# Patient Record
Sex: Male | Born: 1955 | Race: White | Hispanic: No | Marital: Married | Smoking: Never smoker
Health system: Southern US, Community
[De-identification: ages and names within clinical notes are randomized; demographics above are authoritative.]

## PROBLEM LIST (undated history)

## (undated) DIAGNOSIS — I10 Essential (primary) hypertension: Secondary | ICD-10-CM

## (undated) DIAGNOSIS — I341 Nonrheumatic mitral (valve) prolapse: Secondary | ICD-10-CM

## (undated) DIAGNOSIS — E785 Hyperlipidemia, unspecified: Secondary | ICD-10-CM

## (undated) DIAGNOSIS — C801 Malignant (primary) neoplasm, unspecified: Secondary | ICD-10-CM

## (undated) HISTORY — DX: Nonrheumatic mitral (valve) prolapse: I34.1

## (undated) HISTORY — DX: Essential (primary) hypertension: I10

## (undated) HISTORY — PX: MOHS SURGERY: SUR867

## (undated) HISTORY — PX: WISDOM TOOTH EXTRACTION: SHX21

## (undated) HISTORY — DX: Malignant (primary) neoplasm, unspecified: C80.1

## (undated) HISTORY — DX: Hyperlipidemia, unspecified: E78.5

## (undated) HISTORY — PX: OTHER SURGICAL HISTORY: SHX169

---
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
## 2005-10-08 HISTORY — PX: COLONOSCOPY: SHX174

## 2017-04-29 DIAGNOSIS — F4321 Adjustment disorder with depressed mood: Secondary | ICD-10-CM | POA: Diagnosis not present

## 2017-06-06 DIAGNOSIS — F4321 Adjustment disorder with depressed mood: Secondary | ICD-10-CM | POA: Diagnosis not present

## 2017-07-01 DIAGNOSIS — F4321 Adjustment disorder with depressed mood: Secondary | ICD-10-CM | POA: Diagnosis not present

## 2017-07-13 DIAGNOSIS — F4321 Adjustment disorder with depressed mood: Secondary | ICD-10-CM | POA: Diagnosis not present

## 2017-07-26 DIAGNOSIS — F4321 Adjustment disorder with depressed mood: Secondary | ICD-10-CM | POA: Diagnosis not present

## 2017-10-12 DIAGNOSIS — F4321 Adjustment disorder with depressed mood: Secondary | ICD-10-CM | POA: Diagnosis not present

## 2017-10-26 DIAGNOSIS — F4321 Adjustment disorder with depressed mood: Secondary | ICD-10-CM | POA: Diagnosis not present

## 2017-11-16 DIAGNOSIS — F4321 Adjustment disorder with depressed mood: Secondary | ICD-10-CM | POA: Diagnosis not present

## 2017-11-19 DIAGNOSIS — R002 Palpitations: Secondary | ICD-10-CM | POA: Diagnosis not present

## 2017-11-19 DIAGNOSIS — N528 Other male erectile dysfunction: Secondary | ICD-10-CM | POA: Diagnosis not present

## 2017-11-19 DIAGNOSIS — H6123 Impacted cerumen, bilateral: Secondary | ICD-10-CM | POA: Diagnosis not present

## 2017-11-19 DIAGNOSIS — Z1389 Encounter for screening for other disorder: Secondary | ICD-10-CM | POA: Diagnosis not present

## 2017-11-19 DIAGNOSIS — I10 Essential (primary) hypertension: Secondary | ICD-10-CM | POA: Diagnosis not present

## 2017-12-07 DIAGNOSIS — F4321 Adjustment disorder with depressed mood: Secondary | ICD-10-CM | POA: Diagnosis not present

## 2018-01-02 DIAGNOSIS — H5213 Myopia, bilateral: Secondary | ICD-10-CM | POA: Diagnosis not present

## 2018-01-02 DIAGNOSIS — D3131 Benign neoplasm of right choroid: Secondary | ICD-10-CM | POA: Diagnosis not present

## 2018-01-02 DIAGNOSIS — H524 Presbyopia: Secondary | ICD-10-CM | POA: Diagnosis not present

## 2018-01-04 DIAGNOSIS — F4321 Adjustment disorder with depressed mood: Secondary | ICD-10-CM | POA: Diagnosis not present

## 2018-01-18 DIAGNOSIS — F4321 Adjustment disorder with depressed mood: Secondary | ICD-10-CM | POA: Diagnosis not present

## 2018-01-20 DIAGNOSIS — L814 Other melanin hyperpigmentation: Secondary | ICD-10-CM | POA: Diagnosis not present

## 2018-01-20 DIAGNOSIS — Z85828 Personal history of other malignant neoplasm of skin: Secondary | ICD-10-CM | POA: Diagnosis not present

## 2018-01-20 DIAGNOSIS — D225 Melanocytic nevi of trunk: Secondary | ICD-10-CM | POA: Diagnosis not present

## 2018-01-20 DIAGNOSIS — L738 Other specified follicular disorders: Secondary | ICD-10-CM | POA: Diagnosis not present

## 2018-02-08 DIAGNOSIS — F4321 Adjustment disorder with depressed mood: Secondary | ICD-10-CM | POA: Diagnosis not present

## 2018-02-10 DIAGNOSIS — Z125 Encounter for screening for malignant neoplasm of prostate: Secondary | ICD-10-CM | POA: Diagnosis not present

## 2018-02-10 DIAGNOSIS — R82998 Other abnormal findings in urine: Secondary | ICD-10-CM | POA: Diagnosis not present

## 2018-02-10 DIAGNOSIS — Z0000 Encounter for general adult medical examination without abnormal findings: Secondary | ICD-10-CM | POA: Diagnosis not present

## 2018-02-10 DIAGNOSIS — I10 Essential (primary) hypertension: Secondary | ICD-10-CM | POA: Diagnosis not present

## 2018-02-11 DIAGNOSIS — N528 Other male erectile dysfunction: Secondary | ICD-10-CM | POA: Diagnosis not present

## 2018-02-11 DIAGNOSIS — R972 Elevated prostate specific antigen [PSA]: Secondary | ICD-10-CM | POA: Diagnosis not present

## 2018-02-11 DIAGNOSIS — I10 Essential (primary) hypertension: Secondary | ICD-10-CM | POA: Diagnosis not present

## 2018-02-11 DIAGNOSIS — Z0000 Encounter for general adult medical examination without abnormal findings: Secondary | ICD-10-CM | POA: Diagnosis not present

## 2018-02-11 DIAGNOSIS — H6123 Impacted cerumen, bilateral: Secondary | ICD-10-CM | POA: Diagnosis not present

## 2018-02-17 DIAGNOSIS — Z1212 Encounter for screening for malignant neoplasm of rectum: Secondary | ICD-10-CM | POA: Diagnosis not present

## 2018-02-24 ENCOUNTER — Emergency Department
Admission: EM | Admit: 2018-02-24 | Discharge: 2018-02-25 | Discharge status: Against Medical Advice | Payer: BLUE CROSS/BLUE SHIELD | Source: Ambulatory Visit

## 2018-02-24 NOTE — ED Notes (Signed)
Pt reports he wants to leave and feels better and wants to go home. Seen walking out of emergency department

## 2018-02-24 NOTE — ED Triage Notes (Signed)
Pt here from urgent care. Pt on cipro for 2 weeks to "rule out prostatits" noticed increasing SOB and just trouble getting a deep breath. Pt reports 30 min after taking cipro he had trouble taking a deep breath. Pt reports it feels like anxiety. EKG at triage.        Triage Note   Rogelia Mire, RN

## 2018-02-25 ENCOUNTER — Observation Stay
Admission: EM | Admit: 2018-02-25 | Discharge: 2018-02-26 | Disposition: A | Discharge status: Home / Self Care | Payer: BLUE CROSS/BLUE SHIELD | Source: Ambulatory Visit | Attending: Emergency Medicine | Admitting: Emergency Medicine

## 2018-02-25 ENCOUNTER — Emergency Department: Payer: BLUE CROSS/BLUE SHIELD

## 2018-02-25 ENCOUNTER — Encounter: Payer: Self-pay | Admitting: Emergency Medicine

## 2018-02-25 DIAGNOSIS — R0602 Shortness of breath: Secondary | ICD-10-CM

## 2018-02-25 DIAGNOSIS — I341 Nonrheumatic mitral (valve) prolapse: Secondary | ICD-10-CM | POA: Insufficient documentation

## 2018-02-25 DIAGNOSIS — I10 Essential (primary) hypertension: Secondary | ICD-10-CM | POA: Insufficient documentation

## 2018-02-25 DIAGNOSIS — R0600 Dyspnea, unspecified: Principal | ICD-10-CM | POA: Insufficient documentation

## 2018-02-25 DIAGNOSIS — F419 Anxiety disorder, unspecified: Secondary | ICD-10-CM

## 2018-02-25 DIAGNOSIS — I77810 Thoracic aortic ectasia: Secondary | ICD-10-CM | POA: Insufficient documentation

## 2018-02-25 HISTORY — DX: Essential (primary) hypertension: I10

## 2018-02-25 LAB — BASIC METABOLIC PANEL
Anion Gap: 13 (ref 7–16)
CO2: 24 mmol/L (ref 20–28)
Calcium: 9.5 mg/dL (ref 8.6–10.2)
Chloride: 103 mmol/L (ref 96–108)
Creatinine: 1.11 mg/dL (ref 0.67–1.17)
GFR,Black: 81 *
GFR,Caucasian: 70 *
Glucose: 105 mg/dL — ABNORMAL HIGH (ref 60–99)
Lab: 17 mg/dL (ref 6–20)
Potassium: 4.6 mmol/L (ref 3.3–5.1)
Sodium: 140 mmol/L (ref 133–145)

## 2018-02-25 LAB — HM HIV SCREENING OFFERED

## 2018-02-25 LAB — HOLD GREEN WITH GEL

## 2018-02-25 LAB — CBC AND DIFFERENTIAL
Baso # K/uL: 0 10*3/uL (ref 0.0–0.1)
Basophil %: 0.5 %
Eos # K/uL: 0.2 10*3/uL (ref 0.0–0.5)
Eosinophil %: 1.8 %
Hematocrit: 47 % (ref 40–51)
Hemoglobin: 15.9 g/dL (ref 13.7–17.5)
IMM Granulocytes #: 0 10*3/uL
IMM Granulocytes: 0.2 %
Lymph # K/uL: 1.9 10*3/uL (ref 1.3–3.6)
Lymphocyte %: 23.4 %
MCH: 29 pg/cell (ref 26–32)
MCHC: 34 g/dL (ref 32–37)
MCV: 84 fL (ref 79–92)
Mono # K/uL: 1 10*3/uL — ABNORMAL HIGH (ref 0.3–0.8)
Monocyte %: 12.4 %
Neut # K/uL: 5 10*3/uL (ref 1.8–5.4)
Nucl RBC # K/uL: 0 10*3/uL (ref 0.0–0.0)
Nucl RBC %: 0 /100 WBC (ref 0.0–0.2)
Platelets: 262 10*3/uL (ref 150–330)
RBC: 5.6 MIL/uL (ref 4.6–6.1)
RDW: 12.8 % (ref 11.6–14.4)
Seg Neut %: 61.7 %
WBC: 8.1 10*3/uL (ref 4.2–9.1)

## 2018-02-25 LAB — D-DIMER, QUANTITATIVE: D-Dimer: 0.26 ug/mL FEU (ref 0.00–0.50)

## 2018-02-25 LAB — HOLD BLUE

## 2018-02-25 MED ORDER — DIAZEPAM 2 MG PO TABS *I*
4.0000 mg | ORAL_TABLET | Freq: Once | ORAL | Status: DC
Start: 2018-02-25 — End: 2018-02-26
  Filled 2018-02-25: qty 2

## 2018-02-25 MED ORDER — SODIUM CHLORIDE 0.9 % IV BOLUS *I*
500.0000 mL | Freq: Once | Status: AC
Start: 2018-02-25 — End: 2018-02-26

## 2018-02-25 MED ORDER — IOHEXOL 350 MG/ML (OMNIPAQUE) IV SOLN *I*
1.0000 mL | Freq: Once | INTRAVENOUS | Status: AC
Start: 2018-02-25 — End: 2018-02-25
  Administered 2018-02-25: 70 mL via INTRAVENOUS

## 2018-02-25 MED ORDER — HYDROXYZINE HCL 25 MG PO TABS *I*
25.0000 mg | ORAL_TABLET | Freq: Once | ORAL | Status: AC
Start: 2018-02-25 — End: 2018-02-25
  Administered 2018-02-25: 25 mg via ORAL
  Filled 2018-02-25: qty 1

## 2018-02-25 MED ORDER — SODIUM CHLORIDE 0.9 % FLUSH FOR PUMPS *I*
0.0000 mL/h | INTRAVENOUS | Status: DC | PRN
Start: 2018-02-25 — End: 2018-02-26

## 2018-02-25 NOTE — First Provider Contact (Signed)
ED First Provider Contact Note    Initial provider evaluation performed by   ED First Provider Contact     Date/Time Event User Comments    02/25/18 1212 ED First Provider Contact Valarie Cones, [facility] Initial Face to Face Provider Contact        Patient is frequent flyer, having intermittent SOB, states he was sent in by PCP     Vital signs reviewed.    Orders placed:  EKG, LABS and saline lock, telemetry     Patient requires further evaluation.     Anthonny Schiller ANN [place], NP, 02/25/2018, 12:12 PM     Valarie Cones, Chales Abrahams, NP  02/25/18 1214

## 2018-02-25 NOTE — ED Triage Notes (Signed)
Flew in from [place] two weeks ago and has been SOB since. His PCP from [place] would like a PE workup       Triage Note   John Lango, RN

## 2018-02-25 NOTE — Discharge Instructions (Addendum)
Your blood studies, heart tracing (ECG), and oxygen level while walking are normal.    The chest CT incidentally showed widened aorta above the heart - probably NOT the cause of the shortness of breath symptoms presently - and needs follow-up CT scan in about 9 - 12 months (this can be done through your local doctor at home).  There was no "blood clot", mass, or pneumonia in the lungs.  The report of this study is copied below.    Rest this coming day.  No unnecessary exertion.  Usual diet & medications.    Please call your primary MD at home for follow-up appointment in the next week.    Return to ED for any severe or worsening symptoms.    CT angiogram of chest:    CT angio chest (Final result)   Result time 02/25/18 21:07:51   Final result by Blenda Bridegroom, MD (02/25/18 21:07:51)                Impression:      The examination is diagnostic for evaluation of the pulmonary arteries.    No evidence of pulmonary embolism.     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    END OF IMPRESSION    [facility] submits this DICOM format image data and final report to the [facility], an independent secure electronic health information exchange, on a reciprocally searchable basis (with patient authorization) for a minimum of 12 months after exam   date.            Narrative:    02/25/2018 8:21 PM    CT ANGIO CHEST    CLINICAL INFORMATION:  r/o pe; >2 weeks sob after flight;  ;     COMPARISON:  None.    PROCEDURE:  CT angiographic technique was performed of the pulmonary arteries. Multiplanar reconstructions and MIP reconstructions were reviewed. Automated exposure control, adjustment of the mA and/or kV according to patient size, and/or iterative   reconstruction techniques were utilized for radiation dose optimization. Amount and type of contrast that was injected and/or discarded is recorded in the electronic medical record.    FINDINGS:      QUALITY: Diagnostic for evaluation for pulmonary embolism.    Pulmonary  Arteries: No evidence of pulmonary embolism.    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    Veins: Not formally evaluated.    Cardiac Chambers: No acute pathologic findings identified.    Pericardium: No pericardial effusion.    Neck base:  Normal.     Mediastinum and lymph nodes:  No lymphadenopathy by CT size criteria.    Pleura and Pleural space:  No pleural effusion, no pneumothorax.       Airways (Trachea/Bronchi):  The trachea and central bronchi are normal.      Lungs:  Mild dependent posterior basal bilateral atelectasis.    Upper abdomen:  Limited coverage and early contrast phase evaluation limits diagnostic evaluation.  No gross abnormality identified.      Soft Tissues/Musculoskeletal: No acute or significant abnormalities noted.

## 2018-02-25 NOTE — Consults (Signed)
Cardiac Surgery History and Physical Note:    HPI:   CC: SOB/anxiety after taking Cipro    62 y.o. male with HTN (on metoprolol), mitral valve prolapse, who presents with c/o intermittent SOB (inability to take a deep breath) and feelings of anxiety which patient attributes to taking Ciprofloxacin (states he was prescribed Cipro due to "elevated PSA detected during a routine physical exam"). The patient called his PCP in [place] who recommended that the patient report to the ED for a CT angio to rule out a pulmonary embolism, given the patient's recent history of frequent travel by airplane. CT angio revealed an ectatic ascending aorta measuring 41 mm, for which Cardiac Surgery is consulted.     Patient is unable to describe the frequency or duration of these episodes, but states that the episode last night began around 5 PM and resolved when he decided to leave the ED ~midnight without being evaluated. He denies chest trauma, cough, orthopnea, chest pain, diaphoresis, dizziness, light-headedness, palpitations, and presyncope.     ROS - General ROS: negative  Allergy and Immunology ROS: negative  Hematological and Lymphatic ROS: negative  Respiratory ROS: positive for - shortness of breath (episodic)  Cardiovascular ROS: no chest pain or dyspnea on exertion  Gastrointestinal ROS: no abdominal pain, change in bowel habits, or black or bloody stools  Genito-Urinary ROS: no dysuria, trouble voiding, or hematuria  Musculoskeletal ROS: negative  Neurological ROS: no TIA or stroke symptoms  Dermatological ROS: negative for rash    Patient History:   Past medical history:  Past Medical History:   Diagnosis Date    High blood pressure        Past surgical history:  History reviewed. No pertinent surgical history.    Family history:  No family history on file.    Social history:  Social History     Social History    Marital status: Single     Spouse name: N/A    Number of children: N/A    Years of education: N/A      Social History Main Topics    Smoking status: None    Smokeless tobacco: None    Alcohol use None    Drug use: Unknown    Sexual activity: Not Asked     Other Topics Concern    None     Social History Narrative    None       Medications:     (Not in a hospital admission)    Physical Exam:     Vitals:    02/25/18 2250   BP: 141/86   Pulse: 59   Resp: 18   Temp: 37.1 C (98.8 F)     General: no apparent distress, appears stated age  Neuro: A&Ox3, no focal deficits  HEENT: atraumatic, sclera anicteric, PERRL, MMM  Neck: supple, no JVD  Heart: normal LVAD hum  Lungs: unlabored respiration, clear to auscultation bilaterally without rhonchi, rales, or wheezes  Abdomen: soft, non-distended, non-tender, no obvious hepatosplenomegaly  Extremities:  No edema in lower extremities  Skin: warm and dry, no rashes, lesions, ulcers  Psych: appropriate affect    Labs:     Heme/Coag:  Recent Labs  Lab 02/25/18  1518   WBC 8.1   Hemoglobin 15.9   Hematocrit 47   Platelets 262    ABG:No results for input(s): APH, APCO2, APO2, AHCO3, ABE, WBLAC in the last 168 hours.   Chemistry:  Recent Labs  Lab 02/25/18  [phone]  Sodium 140   Potassium 4.6   Chloride 103   CO2 24   UN 17   Creatinine 1.11   Glucose 105*   Calcium 9.5    LFT:No results for input(s): AST, ALT, ALK, TB, DB, ALB, PALB in the last 168 hours.     Review of Imaging Studies:   Ct Angio Chest    Result Date: 02/25/2018  The examination is diagnostic for evaluation of the pulmonary arteries. No evidence of pulmonary embolism. 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 END OF IMPRESSION [facility] submits this DICOM format image data and final report to the [facility], an independent secure electronic health information exchange, on a reciprocally searchable basis (with patient authorization) for a minimum of 12 months after exam date.      Assessment and Plan:   John Dillon is a 62 y.o. male with HTN (on metoprolol), mitral valve prolapse, non-smoker, who presents  with c/o intermittent SOB (inability to take a deep breath) and feelings of anxiety which patient attributes to recently starting Ciprofloxacin. 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    1. No acute surgical intervention  2. Patient resides in [place] but should undergo repeat CT angio chest in 1 year with follow-up to monitor for progression.   3. The above history, physical exam, and assessment were discussed with Dr. Eldridge Abrahams and the plan reflects his direct supervision.    Blain Pais, MD  General Surgery Resident

## 2018-02-25 NOTE — ED Provider Notes (Addendum)
History     Chief Complaint   Patient presents with    Shortness of Breath     This is a 62 y.o. male with a PMHx of anxiety and HTN who presents to the ED with c/o shortness of breath. Patient reports that he flew from [place] to [place] two weeks ago and has been experiencing shortness of breath ever since. Was recently started on Cipro due to a suspected uti from enlarged prostate by PCP and correlates difficulty breathing to this. describes his shortness of breath as that he is experiencing an inability to get a full deep breath but no worse on exertion, no pnd or orthopnea. Patient noted that he has been exercising without difficulty since his shortness of breath started (runs almost daily). Under increased stress lately for mult psychosocial reasons. Patient denies chest pain, bloody urine, dysuria, night sweats, leg swelling, rashes, fever, nausea, or vomiting. No hx of asthma, interstitial lung diseases or autoimmune diseases.        History provided by:  Patient  Shortness of Breath   Severity:  Moderate  Onset quality:  Sudden  Duration:  2 weeks  Timing:  Intermittent  Progression:  Worsening  Chronicity:  New  Context comment:  After taking antibiotic  Relieved by:  None tried  Worsened by:  Stress  Associated symptoms: no abdominal pain, no chest pain, no fever, no headaches, no rash and no vomiting        Medical/Surgical/Family History     Past Medical History:   Diagnosis Date    High blood pressure         There is no problem list on file for this patient.           History reviewed. No pertinent surgical history.  No family history on file.       Social History   Substance Use Topics    Smoking status: Not on file    Smokeless tobacco: Not on file    Alcohol use Not on file     Living Situation     Questions Responses    Patient lives with     Homeless     Caregiver for other family member     External Services     Employment     Domestic Violence Risk                 Review of  Systems   Review of Systems   Constitutional: Negative for chills and fever.   HENT: Negative for congestion.    Eyes: Negative for visual disturbance.   Respiratory: Positive for shortness of breath.    Cardiovascular: Negative for chest pain.   Gastrointestinal: Negative for abdominal pain, diarrhea, nausea and vomiting.   Genitourinary: Negative for difficulty urinating and dysuria.   Musculoskeletal: Negative for back pain.   Skin: Negative for color change and rash.   Neurological: Negative for headaches.   Psychiatric/Behavioral: Positive for dysphoric mood. Negative for confusion. The patient is nervous/anxious.        Physical Exam     Triage Vitals  Triage Start: Start, (02/25/18 1210)   First Recorded BP: (!) 155/91, Resp: 18, Temp: 36.8 C (98.2 F), Temp src: TEMPORAL Oxygen Therapy SpO2: 98 %, Oximetry Source: Lt Hand, O2 Device: None (Room air), Heart Rate: 77, (02/25/18 1210) Heart Rate (via Pulse Ox): 72, (02/25/18 1820).  First Pain Reported  0-10 Scale: 6, (02/25/18 1210)       Physical  Exam   Constitutional: He appears well-developed and well-nourished.   Well appearing fit male wringing hands repeatedly on exam, repeatedly taking deep inspirations during hpi but speaking in full sentences, no accessory muscle use   HENT:   Head: Normocephalic and atraumatic.   Right Ear: External ear normal.   Left Ear: External ear normal.   Nose: Nose normal.   Eyes: Pupils are equal, round, and reactive to light. EOM are normal. No scleral icterus.   Neck: No tracheal deviation present.   Cardiovascular: Intact distal pulses.    Pulmonary/Chest: Effort normal. No stridor. No respiratory distress.   Abdominal: He exhibits no distension.   Musculoskeletal: Normal range of motion. He exhibits no edema, tenderness or deformity.   Neurological: He is alert. He exhibits normal muscle tone.   Skin: Skin is warm and dry. No pallor.   Psychiatric: Judgment and thought content normal.   Visibly anxious on exam.  Appropriate eye contact. Denies suicidal thoughts. Intermittently teary eyed   Nursing note and vitals reviewed.      Medical Decision Making      Amount and/or Complexity of Data Reviewed  Clinical lab tests: ordered and reviewed  Tests in the radiology section of CPT: ordered and reviewed  Tests in the medicine section of CPT: ordered and reviewed  Independent visualization of images, tracings, or specimens: yes        Initial Evaluation:  ED First Provider Contact     Date/Time Event User Comments    02/25/18 1212 ED First Provider Contact Glennon Mac ANN Initial Face to Face Provider Contact          Patient seen by me on arrival date of 02/25/2018.    Assessment:  62 y.o.visibly anxious male (multiple active psychosocial stressors currently), frequent travel to and from [place] presents with progressive dyspnea he presumes is associated with current Cipro taking for presumed UTI. Physical exam unremarkable Patient works out daily, denies any chest pain, no worse with daily running. Never smoker, no illicits      Differential Diagnosis includes:  PE (less likely however does travel frequently) vs anxiety secondary to psychosocial stressors given consistent with HPI and PE - teary eyed on exam, restless, multiple deep breaths are worsened when talking about psychosocial stressors    No concern for ACS, EKG unremarkable    Plan: Will follow up labs ordered from triage, despite dimer results will CT angio given dimer sensitivity decreased markedly after one week of symptoms    Anticipatory guidance with dealing with psychosocial stressor, if workup unremarkable, with prn atarax for anxiety and outpatient resources       [facility]   I Shelby, am scribing for and in the presence of Dr. Lourena Simmonds.  02/25/2018 8:22 PM      Scribe Attestation:    I, Dr. Mayra Neer, MD, personally performed the services described in this documentation, as scribed by  SOUTHIVONGNORATH, JASMINE in my presence, and it is accurate and complete.    Author:  Mayra Neer, MD       Southivongnorath, [facility]  02/25/18 2019       Southivongnorath, Jasmine  02/25/18 2022       Cadden Elizondo, Gaspar Cola, MD  03/01/18 [phone]

## 2018-02-25 NOTE — ED Notes (Signed)
02/25/18 2127   Observation Care   Observation care initiated  Yes   Patient has been verbally notified of their observation status Yes

## 2018-02-25 NOTE — ED Notes (Addendum)
Pt states that he has been having SOB on deep inhalations and had work up for a PE from his PCP a few weeks ago.  Pt states that he has been flying back and forth regularly from [place].  Pt states that he had elevated PSA thinking that he had a infected prostate and taking medications. Pt denies any N/V but states that he ran a mile yesterday with no concerns.   Pt with personal stressors. Pt denies SI/HI

## 2018-02-26 ENCOUNTER — Other Ambulatory Visit: Payer: Self-pay | Admitting: Cardiology

## 2018-02-26 DIAGNOSIS — I499 Cardiac arrhythmia, unspecified: Secondary | ICD-10-CM

## 2018-02-26 LAB — SPEC COAG REVIEW

## 2018-02-26 LAB — INTERPRETATION,SPEC COAG

## 2018-02-26 LAB — REVIEWED BY:

## 2018-02-26 LAB — NT-PRO BNP: NT-pro BNP: 114 pg/mL (ref 0–900)

## 2018-02-26 NOTE — Progress Notes (Signed)
02/26/18 0957   [facility] Patient Class Review   Patient Class Review Observation     Patient class effective as of 02/25/2018    Hyacinth Meeker RN  Utilization Management  [phone]

## 2018-02-26 NOTE — ED Notes (Signed)
Patient verbalizes understanding of discharge instructions. All questions/concerns addressed. IV removed, appropriate clothing in place, VSS, AOx4, tolerating PO. Patient ambulated out on own, stable while ambulatory. Has an appropriate ride home. Given access to MyChart so patient can review admission results and future appointments.

## 2018-03-04 DIAGNOSIS — R972 Elevated prostate specific antigen [PSA]: Secondary | ICD-10-CM | POA: Diagnosis not present

## 2018-03-04 LAB — EKG 12-LEAD
P: 60 deg
PR: 156 ms
QRS: 60 deg
QRSD: 90 ms
QT: 424 ms
QTc: 427 ms
Rate: 61 {beats}/min
T: 59 deg

## 2018-03-06 DIAGNOSIS — I10 Essential (primary) hypertension: Secondary | ICD-10-CM | POA: Diagnosis not present

## 2018-03-06 DIAGNOSIS — Z6827 Body mass index (BMI) 27.0-27.9, adult: Secondary | ICD-10-CM | POA: Diagnosis not present

## 2018-03-06 DIAGNOSIS — R972 Elevated prostate specific antigen [PSA]: Secondary | ICD-10-CM | POA: Diagnosis not present

## 2018-03-07 DIAGNOSIS — F4321 Adjustment disorder with depressed mood: Secondary | ICD-10-CM | POA: Diagnosis not present

## 2018-03-22 DIAGNOSIS — F4321 Adjustment disorder with depressed mood: Secondary | ICD-10-CM | POA: Diagnosis not present

## 2018-03-25 DIAGNOSIS — I10 Essential (primary) hypertension: Secondary | ICD-10-CM | POA: Diagnosis not present

## 2018-03-28 DIAGNOSIS — I10 Essential (primary) hypertension: Secondary | ICD-10-CM | POA: Diagnosis not present

## 2018-04-11 DIAGNOSIS — F4321 Adjustment disorder with depressed mood: Secondary | ICD-10-CM | POA: Diagnosis not present

## 2018-04-18 DIAGNOSIS — R972 Elevated prostate specific antigen [PSA]: Secondary | ICD-10-CM | POA: Diagnosis not present

## 2018-04-18 DIAGNOSIS — R1032 Left lower quadrant pain: Secondary | ICD-10-CM | POA: Diagnosis not present

## 2018-05-17 DIAGNOSIS — F4321 Adjustment disorder with depressed mood: Secondary | ICD-10-CM | POA: Diagnosis not present

## 2018-06-21 DIAGNOSIS — F4321 Adjustment disorder with depressed mood: Secondary | ICD-10-CM | POA: Diagnosis not present

## 2018-07-12 DIAGNOSIS — F4321 Adjustment disorder with depressed mood: Secondary | ICD-10-CM | POA: Diagnosis not present

## 2018-07-28 DIAGNOSIS — R972 Elevated prostate specific antigen [PSA]: Secondary | ICD-10-CM | POA: Diagnosis not present

## 2018-08-01 DIAGNOSIS — F4321 Adjustment disorder with depressed mood: Secondary | ICD-10-CM | POA: Diagnosis not present

## 2018-08-01 DIAGNOSIS — R972 Elevated prostate specific antigen [PSA]: Secondary | ICD-10-CM | POA: Diagnosis not present

## 2018-08-05 DIAGNOSIS — M79642 Pain in left hand: Secondary | ICD-10-CM | POA: Diagnosis not present

## 2018-08-30 DIAGNOSIS — F4321 Adjustment disorder with depressed mood: Secondary | ICD-10-CM | POA: Diagnosis not present

## 2018-09-29 DIAGNOSIS — F4321 Adjustment disorder with depressed mood: Secondary | ICD-10-CM | POA: Diagnosis not present

## 2018-11-13 DIAGNOSIS — F4321 Adjustment disorder with depressed mood: Secondary | ICD-10-CM | POA: Diagnosis not present

## 2019-01-13 DIAGNOSIS — F4321 Adjustment disorder with depressed mood: Secondary | ICD-10-CM | POA: Diagnosis not present

## 2019-01-27 DIAGNOSIS — D224 Melanocytic nevi of scalp and neck: Secondary | ICD-10-CM | POA: Diagnosis not present

## 2019-01-27 DIAGNOSIS — D225 Melanocytic nevi of trunk: Secondary | ICD-10-CM | POA: Diagnosis not present

## 2019-01-27 DIAGNOSIS — L814 Other melanin hyperpigmentation: Secondary | ICD-10-CM | POA: Diagnosis not present

## 2019-01-27 DIAGNOSIS — L821 Other seborrheic keratosis: Secondary | ICD-10-CM | POA: Diagnosis not present

## 2019-02-14 DIAGNOSIS — F4321 Adjustment disorder with depressed mood: Secondary | ICD-10-CM | POA: Diagnosis not present

## 2019-02-27 DIAGNOSIS — F4321 Adjustment disorder with depressed mood: Secondary | ICD-10-CM | POA: Diagnosis not present

## 2019-03-20 DIAGNOSIS — F4321 Adjustment disorder with depressed mood: Secondary | ICD-10-CM | POA: Diagnosis not present

## 2019-04-09 DIAGNOSIS — N529 Male erectile dysfunction, unspecified: Secondary | ICD-10-CM | POA: Diagnosis not present

## 2019-04-09 DIAGNOSIS — R972 Elevated prostate specific antigen [PSA]: Secondary | ICD-10-CM | POA: Diagnosis not present

## 2019-04-09 DIAGNOSIS — Z1331 Encounter for screening for depression: Secondary | ICD-10-CM | POA: Diagnosis not present

## 2019-04-09 DIAGNOSIS — E785 Hyperlipidemia, unspecified: Secondary | ICD-10-CM | POA: Diagnosis not present

## 2019-04-09 DIAGNOSIS — I10 Essential (primary) hypertension: Secondary | ICD-10-CM | POA: Diagnosis not present

## 2019-04-17 DIAGNOSIS — F4321 Adjustment disorder with depressed mood: Secondary | ICD-10-CM | POA: Diagnosis not present

## 2019-04-17 DIAGNOSIS — H5213 Myopia, bilateral: Secondary | ICD-10-CM | POA: Diagnosis not present

## 2019-04-17 DIAGNOSIS — H524 Presbyopia: Secondary | ICD-10-CM | POA: Diagnosis not present

## 2019-04-17 DIAGNOSIS — H5203 Hypermetropia, bilateral: Secondary | ICD-10-CM | POA: Diagnosis not present

## 2019-04-17 DIAGNOSIS — D3131 Benign neoplasm of right choroid: Secondary | ICD-10-CM | POA: Diagnosis not present

## 2019-04-24 DIAGNOSIS — F4321 Adjustment disorder with depressed mood: Secondary | ICD-10-CM | POA: Diagnosis not present

## 2019-05-22 DIAGNOSIS — F4321 Adjustment disorder with depressed mood: Secondary | ICD-10-CM | POA: Diagnosis not present

## 2019-06-19 DIAGNOSIS — F4321 Adjustment disorder with depressed mood: Secondary | ICD-10-CM | POA: Diagnosis not present

## 2019-07-31 DIAGNOSIS — F4321 Adjustment disorder with depressed mood: Secondary | ICD-10-CM | POA: Diagnosis not present

## 2019-08-21 DIAGNOSIS — F4321 Adjustment disorder with depressed mood: Secondary | ICD-10-CM | POA: Diagnosis not present

## 2019-08-24 DIAGNOSIS — I10 Essential (primary) hypertension: Secondary | ICD-10-CM | POA: Diagnosis not present

## 2019-08-24 DIAGNOSIS — Z125 Encounter for screening for malignant neoplasm of prostate: Secondary | ICD-10-CM | POA: Diagnosis not present

## 2019-08-24 DIAGNOSIS — R82998 Other abnormal findings in urine: Secondary | ICD-10-CM | POA: Diagnosis not present

## 2019-08-24 DIAGNOSIS — E7849 Other hyperlipidemia: Secondary | ICD-10-CM | POA: Diagnosis not present

## 2019-08-24 DIAGNOSIS — Z0000 Encounter for general adult medical examination without abnormal findings: Secondary | ICD-10-CM | POA: Diagnosis not present

## 2019-08-27 DIAGNOSIS — I10 Essential (primary) hypertension: Secondary | ICD-10-CM | POA: Diagnosis not present

## 2019-08-27 DIAGNOSIS — E785 Hyperlipidemia, unspecified: Secondary | ICD-10-CM | POA: Diagnosis not present

## 2019-08-27 DIAGNOSIS — N529 Male erectile dysfunction, unspecified: Secondary | ICD-10-CM | POA: Diagnosis not present

## 2019-08-27 DIAGNOSIS — Z0000 Encounter for general adult medical examination without abnormal findings: Secondary | ICD-10-CM | POA: Diagnosis not present

## 2019-08-27 DIAGNOSIS — R972 Elevated prostate specific antigen [PSA]: Secondary | ICD-10-CM | POA: Diagnosis not present

## 2019-08-31 ENCOUNTER — Encounter: Payer: Self-pay | Admitting: Internal Medicine

## 2019-09-17 ENCOUNTER — Encounter: Payer: Self-pay | Admitting: Internal Medicine

## 2019-09-17 ENCOUNTER — Encounter (INDEPENDENT_AMBULATORY_CARE_PROVIDER_SITE_OTHER): Payer: Self-pay

## 2019-09-17 ENCOUNTER — Ambulatory Visit (AMBULATORY_SURGERY_CENTER): Payer: BC Managed Care – PPO | Admitting: *Deleted

## 2019-09-17 ENCOUNTER — Other Ambulatory Visit: Payer: Self-pay

## 2019-09-17 VITALS — Temp 96.9°F | Ht 70.0 in | Wt 198.8 lb

## 2019-09-17 DIAGNOSIS — N400 Enlarged prostate without lower urinary tract symptoms: Secondary | ICD-10-CM | POA: Diagnosis not present

## 2019-09-17 DIAGNOSIS — Z1159 Encounter for screening for other viral diseases: Secondary | ICD-10-CM

## 2019-09-17 DIAGNOSIS — R972 Elevated prostate specific antigen [PSA]: Secondary | ICD-10-CM | POA: Diagnosis not present

## 2019-09-17 DIAGNOSIS — Z1211 Encounter for screening for malignant neoplasm of colon: Secondary | ICD-10-CM

## 2019-09-17 MED ORDER — SUPREP BOWEL PREP KIT 17.5-3.13-1.6 GM/177ML PO SOLN: 1.0000 | Freq: Once | ORAL | 0 refills | Status: AC

## 2019-09-17 NOTE — Progress Notes (Signed)
No egg or soy allergy known to patient  No issues with past sedation with any surgeries  or procedures, no intubation problems  No diet pills per patient No home 02 use per patient  No blood thinners per patient  Pt denies issues with constipation  No A fib or A flutter  EMMI video sent to pt's e mail  Sup $15 coupon   Due to the COVID-19 pandemic we are asking patients to follow these guidelines. Please only bring one care partner. Please be aware that your care partner may wait in the car in the parking lot or if they feel like they will be too hot to wait in the car, they may wait in the lobby on the 4th floor. All care partners are required to wear a mask the entire time (we do not have any that we can provide them), they need to practice social distancing, and we will do a Covid check for all patient's and care partners when you arrive. Also we will check their temperature and your temperature. If the care partner waits in their car they need to stay in the parking lot the entire time and we will call them on their cell phone when the patient is ready for discharge so they can bring the car to the front of the building. Also all patient's will need to wear a mask into building.

## 2019-09-18 ENCOUNTER — Other Ambulatory Visit: Payer: Self-pay | Admitting: Urology

## 2019-09-18 DIAGNOSIS — F4321 Adjustment disorder with depressed mood: Secondary | ICD-10-CM | POA: Diagnosis not present

## 2019-09-18 DIAGNOSIS — R972 Elevated prostate specific antigen [PSA]: Secondary | ICD-10-CM

## 2019-09-23 DIAGNOSIS — Z1212 Encounter for screening for malignant neoplasm of rectum: Secondary | ICD-10-CM | POA: Diagnosis not present

## 2019-09-25 ENCOUNTER — Ambulatory Visit (INDEPENDENT_AMBULATORY_CARE_PROVIDER_SITE_OTHER): Payer: BC Managed Care – PPO

## 2019-09-25 ENCOUNTER — Other Ambulatory Visit: Payer: Self-pay | Admitting: Internal Medicine

## 2019-09-25 DIAGNOSIS — Z1159 Encounter for screening for other viral diseases: Secondary | ICD-10-CM

## 2019-09-25 LAB — SARS CORONAVIRUS 2 (TAT 6-24 HRS): SARS Coronavirus 2: NEGATIVE

## 2019-09-29 ENCOUNTER — Encounter: Payer: Self-pay | Admitting: Internal Medicine

## 2019-09-29 ENCOUNTER — Ambulatory Visit (AMBULATORY_SURGERY_CENTER): Payer: BC Managed Care – PPO | Admitting: Internal Medicine

## 2019-09-29 ENCOUNTER — Other Ambulatory Visit: Payer: Self-pay

## 2019-09-29 VITALS — BP 128/78 | HR 63 | Temp 97.8°F | Resp 19 | Ht 70.0 in | Wt 198.8 lb

## 2019-09-29 DIAGNOSIS — D120 Benign neoplasm of cecum: Secondary | ICD-10-CM | POA: Diagnosis not present

## 2019-09-29 DIAGNOSIS — D124 Benign neoplasm of descending colon: Secondary | ICD-10-CM | POA: Diagnosis not present

## 2019-09-29 DIAGNOSIS — Z1211 Encounter for screening for malignant neoplasm of colon: Secondary | ICD-10-CM | POA: Diagnosis not present

## 2019-09-29 DIAGNOSIS — D122 Benign neoplasm of ascending colon: Secondary | ICD-10-CM | POA: Diagnosis not present

## 2019-09-29 MED ORDER — SODIUM CHLORIDE 0.9 % IV SOLN: 500.0000 mL | Freq: Once | INTRAVENOUS | Status: DC

## 2019-09-29 NOTE — Progress Notes (Signed)
Pt's states no medical or surgical changes since previsit or office visit.  Temp taken to JB VS taken by DT

## 2019-09-29 NOTE — Progress Notes (Signed)
A/ox3, pleased with MAC, report to RN 

## 2019-09-29 NOTE — Patient Instructions (Signed)
YOU HAD AN ENDOSCOPIC PROCEDURE TODAY AT [facility]:   Refer to the procedure report that was given to you for any specific questions about what was found during the examination.  If the procedure report does not answer your questions, please call your gastroenterologist to clarify.  If you requested that your care partner not be given the details of your procedure findings, then the procedure report has been included in a sealed envelope for you to review at your convenience later.  YOU SHOULD EXPECT: Some feelings of bloating in the abdomen. Passage of more gas than usual.  Walking can help get rid of the air that was put into your GI tract during the procedure and reduce the bloating. If you had a lower endoscopy (such as a colonoscopy or flexible sigmoidoscopy) you may notice spotting of blood in your stool or on the toilet paper. If you underwent a bowel prep for your procedure, you may not have a normal bowel movement for a few days.  Please Note:  You might notice some irritation and congestion in your nose or some drainage.  This is from the oxygen used during your procedure.  There is no need for concern and it should clear up in a day or so.  SYMPTOMS TO REPORT IMMEDIATELY:   Following lower endoscopy (colonoscopy or flexible sigmoidoscopy):  Excessive amounts of blood in the stool  Significant tenderness or worsening of abdominal pains  Swelling of the abdomen that is new, acute  Fever of 100F or higher    For urgent or emergent issues, a gastroenterologist can be reached at any hour by calling [phone].   DIET:  We do recommend a small meal at first, but then you may proceed to your regular diet.  Drink plenty of fluids but you should avoid alcoholic beverages for 24 hours.  ACTIVITY:  You should plan to take it easy for the rest of today and you should NOT DRIVE or use heavy machinery until tomorrow (because of the sedation medicines used during the test).     FOLLOW UP: Our staff will call the number listed on your records 48-72 hours following your procedure to check on you and address any questions or concerns that you may have regarding the information given to you following your procedure. If we do not reach you, we will leave a message.  We will attempt to reach you two times.  During this call, we will ask if you have developed any symptoms of COVID 19. If you develop any symptoms (ie: fever, flu-like symptoms, shortness of breath, cough etc.) before then, please call [phone].  If you test positive for Covid 19 in the 2 weeks post procedure, please call and report this information to [place].    If any biopsies were taken you will be contacted by phone or by letter within the next 1-3 weeks.  Please call us at [phone] if you have not heard about the biopsies in 3 weeks.    SIGNATURES/CONFIDENTIALITY: You and/or your care partner have signed paperwork which will be entered into your electronic medical record.  These signatures attest to the fact that that the information above on your After Visit Summary has been reviewed and is understood.  Full responsibility of the confidentiality of this discharge information lies with you and/or your care-partner.   Resume medications. Information given on polyps.

## 2019-09-29 NOTE — Op Note (Signed)
[facility] Patient Name: Stephen Higgins Procedure Date: 09/29/2019 8:34 AM MRN: [id number] Endoscopist: Jerene Bears , MD Age: 63 Referring MD:  Date of Birth: 1956-06-04 Gender: Male Account #: [id number] Procedure:                Colonoscopy Indications:              Screening for colorectal malignant neoplasm, Last                            colonoscopy > 10 years ago Medicines:                Monitored Anesthesia Care Procedure:                Pre-Anesthesia Assessment:                           - Prior to the procedure, a History and Physical                            was performed, and patient medications and                            allergies were reviewed. The patient's tolerance of                            previous anesthesia was also reviewed. The risks                            and benefits of the procedure and the sedation                            options and risks were discussed with the patient.                            All questions were answered, and informed consent                            was obtained. Prior Anticoagulants: The patient has                            taken no previous anticoagulant or antiplatelet                            agents. ASA Grade Assessment: II - A patient with                            mild systemic disease. After reviewing the risks                            and benefits, the patient was deemed in                            satisfactory condition to undergo the procedure.  After obtaining informed consent, the colonoscope                            was passed under direct vision. Throughout the                            procedure, the patient's blood pressure, pulse, and                            oxygen saturations were monitored continuously. The                            Colonoscope was introduced through the anus and                            advanced to the cecum, identified by  appendiceal                            orifice and ileocecal valve. The colonoscopy was                            performed without difficulty. The patient tolerated                            the procedure well. The quality of the bowel                            preparation was good. The ileocecal valve,                            appendiceal orifice, and rectum were photographed. Scope In: 8:51:40 AM Scope Out: 9:19:52 AM Scope Withdrawal Time: 0 hours 25 minutes 14 seconds  Total Procedure Duration: 0 hours 28 minutes 12 seconds  Findings:                 The digital rectal exam was normal.                           Two sessile polyps were found in the cecum. The                            polyps were 6 to 15 mm in size. These polyps were                            removed with a cold snare. Resection and retrieval                            were complete.                           Four sessile polyps were found in the ascending                            colon. The polyps were 4 to 6  mm in size. These                            polyps were removed with a cold snare. Resection                            and retrieval were complete.                           Two sessile polyps were found in the descending                            colon. The polyps were 4 to 5 mm in size. These                            polyps were removed with a cold snare. Resection                            and retrieval were complete.                           The retroflexed view of the distal rectum and anal                            verge was normal and showed no anal or rectal                            abnormalities. Complications:            No immediate complications. Estimated Blood Loss:     Estimated blood loss was minimal. Impression:               - Two 6 to 15 mm polyps in the cecum, removed with                            a cold snare. Resected and retrieved.                           - Four 4  to 6 mm polyps in the ascending colon,                            removed with a cold snare. Resected and retrieved.                           - Two 4 to 5 mm polyps in the descending colon,                            removed with a cold snare. Resected and retrieved.                           - The distal rectum and anal verge are normal on  retroflexion view. Recommendation:           - Patient has a contact number available for                            emergencies. The signs and symptoms of potential                            delayed complications were discussed with the                            patient. Return to normal activities tomorrow.                            Written discharge instructions were provided to the                            patient.                           - Resume previous diet.                           - Continue present medications.                           - Await pathology results.                           - Repeat colonoscopy is recommended for                            surveillance. The colonoscopy date will be                            determined after pathology results from today's                            exam become available for review. Jerene Bears, MD 09/29/2019 9:25:06 AM This report has been signed electronically.

## 2019-09-29 NOTE — Progress Notes (Signed)
Called to room to assist during endoscopic procedure.  Patient ID and intended procedure confirmed with present staff. Received instructions for my participation in the procedure from the performing physician.  

## 2019-10-01 ENCOUNTER — Telehealth: Payer: Self-pay | Admitting: *Deleted

## 2019-10-01 ENCOUNTER — Telehealth: Payer: Self-pay

## 2019-10-01 NOTE — Telephone Encounter (Signed)
  Follow up Call-  Call back number 09/29/2019  Post procedure Call Back phone  # [phone]  Permission to leave phone message Yes     Patient questions:  Do you have a fever, pain , or abdominal swelling? No. Pain Score  0 *  Have you tolerated food without any problems? Yes.    Have you been able to return to your normal activities? Yes.    Do you have any questions about your discharge instructions: Diet   No. Medications  No. Follow up visit  No.  Do you have questions or concerns about your Care? No.  Actions: * If pain score is 4 or above: No action needed, pain <4.   1. Have you developed a fever since your procedure? no  2.   Have you had an respiratory symptoms (SOB or cough) since your procedure? no  3.   Have you tested positive for COVID 19 since your procedure no  4.   Have you had any family members/close contacts diagnosed with the COVID 19 since your procedure?  no   If yes to any of these questions please route to Joylene John, RN and Alphonsa Gin, Therapist, sports.

## 2019-10-01 NOTE — Telephone Encounter (Signed)
First attempt follow up call to pt, lm on vm 

## 2019-10-04 ENCOUNTER — Encounter: Payer: Self-pay | Admitting: Internal Medicine

## 2019-10-15 ENCOUNTER — Ambulatory Visit
Admission: RE | Admit: 2019-10-15 | Discharge: 2019-10-15 | Disposition: A | Discharge status: Home / Self Care | Payer: BC Managed Care – PPO | Source: Ambulatory Visit | Attending: Urology | Admitting: Urology

## 2019-10-15 ENCOUNTER — Other Ambulatory Visit: Payer: Self-pay

## 2019-10-15 DIAGNOSIS — R972 Elevated prostate specific antigen [PSA]: Secondary | ICD-10-CM | POA: Diagnosis not present

## 2019-10-15 IMAGING — MR MR PROSTATE WO/W CM
56 series · 56 of 56 positions shown · IV contrast (18ml multihance)
Comparison: None.

CLINICAL DATA: Elevated PSA. (5.033

EXAM:
MR PROSTATE WITHOUT AND WITH CONTRAST
TECHNIQUE: Multiplanar multisequence MRI images were obtained of the pelvis
centered about the prostate. Pre and post contrast images were
obtained.
CONTRAST:  18mL MULTIHANCE GADOBENATE DIMEGLUMINE 529 MG/ML IV SOLN

[Series 4: bSSFP fat-sat · axial · 8.0mm · 0.74mm/px · 1 of 28 slices shown]
[im 1/28]
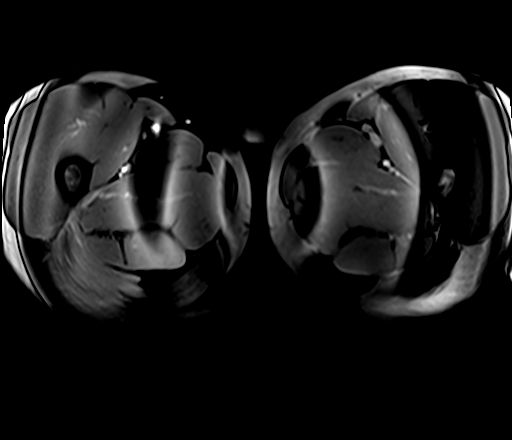

[Series 5: T1 · axial · 5.0mm · 1.25mm/px · 1 of 80 slices shown]
[im 1/80]
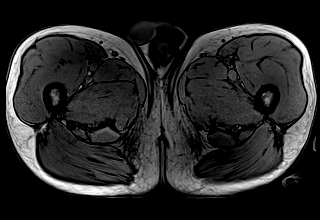

[Series 6: T2 · coronal · 3.5mm · 0.56mm/px · 1 of 21 slices shown (1 of 3)]
[im 1/21]
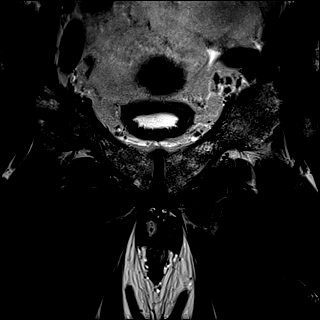

[Series 7: DWI · axial · 3.5mm · 1.56mm/px · 1 of 59 slices shown (1 of 7)]
[im 1/59]
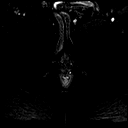

[Series 8: DWI · axial · 3.5mm · 1.56mm/px · 1 of 20 slices shown (2 of 7)]
[im 1/20]
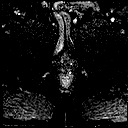

[Series 9: DWI · axial · 3.5mm · 1.75mm/px · 1 of 60 slices shown (3 of 7)]
[im 1/60]
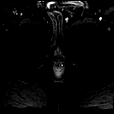

[Series 10: DWI · axial · 3.5mm · 1.75mm/px · 1 of 20 slices shown (4 of 7)]
[im 1/20]
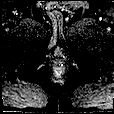

[Series 11: DWI · axial · 3.5mm · 1.56mm/px · 1 of 80 slices shown (5 of 7)]
[im 1/80]
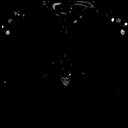

[Series 12: DWI · axial · 3.5mm · 1.56mm/px · 1 of 20 slices shown (6 of 7)]
[im 1/20]
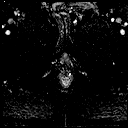

[Series 13: DWI · axial · 3.5mm · 1.56mm/px · 1 of 20 slices shown (7 of 7)]
[im 1/20]
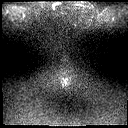

[Series 14: T2 · axial · 3.5mm · 0.56mm/px · 1 of 23 slices shown (2 of 3)]
[im 1/23]
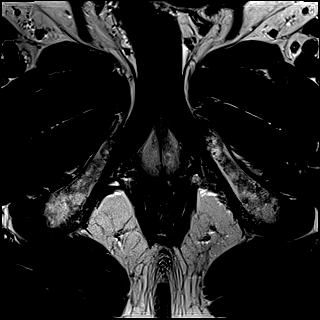

[Series 15: T2 · axial · 1.0mm · 1.04mm/px · 1 of 80 slices shown (3 of 3)]
[im 1/80]
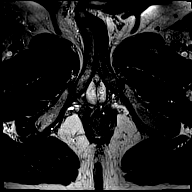

[Series 16: pre t1_twist_tra_dyn_ttc=5.3s · axial · non-contrast · 3.5mm · 0.83mm/px · 1 of 20 slices shown]
[im 1/20]
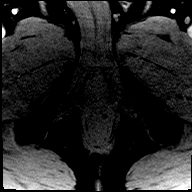

[Series 17: post t1_twist_tra_dyn-copy center · axial · 3.5mm · 0.83mm/px · 1 of 20 slices shown (1 of 22)]
[im 1/20]
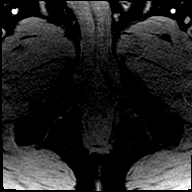

[Series 18: post t1_twist_tra_dyn-copy center · axial · 3.5mm · 0.83mm/px · 1 of 20 slices shown (2 of 22)]
[im 1/20]
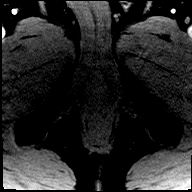

[Series 19: post t1_twist_tra_dyn-copy cent_sub_ttc=(id) · axial · 3.5mm · 0.83mm/px · 1 of 20 slices shown (1 of 21)]
[im 1/20]
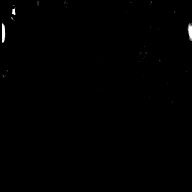

[Series 20: post t1_twist_tra_dyn-copy center · axial · 3.5mm · 0.83mm/px · 1 of 20 slices shown (3 of 22)]
[im 1/20]
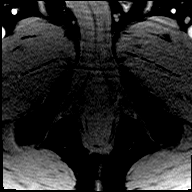

[Series 21: post t1_twist_tra_dyn-copy cent_sub_ttc=(id) · axial · 3.5mm · 0.83mm/px · 1 of 20 slices shown (2 of 21)]
[im 1/20]
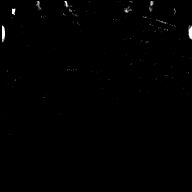

[Series 22: post t1_twist_tra_dyn-copy center · axial · 3.5mm · 0.83mm/px · 1 of 20 slices shown (4 of 22)]
[im 1/20]
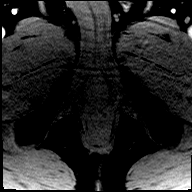

[Series 23: post t1_twist_tra_dyn-copy cent_sub_ttc=(id) · axial · 3.5mm · 0.83mm/px · 1 of 20 slices shown (3 of 21)]
[im 1/20]
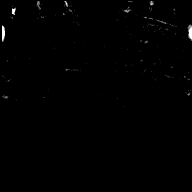

[Series 24: post t1_twist_tra_dyn-copy center · axial · 3.5mm · 0.83mm/px · 1 of 20 slices shown (5 of 22)]
[im 1/20]
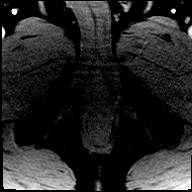

[Series 25: post t1_twist_tra_dyn-copy cent_sub_ttc=(id) · axial · 3.5mm · 0.83mm/px · 1 of 20 slices shown (4 of 21)]
[im 1/20]
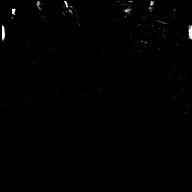

[Series 26: post t1_twist_tra_dyn-copy center · axial · 3.5mm · 0.83mm/px · 1 of 20 slices shown (6 of 22)]
[im 1/20]
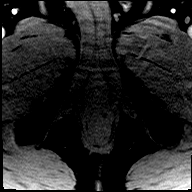

[Series 27: post t1_twist_tra_dyn-copy cent_sub_ttc=(id) · axial · 3.5mm · 0.83mm/px · 1 of 20 slices shown (5 of 21)]
[im 1/20]
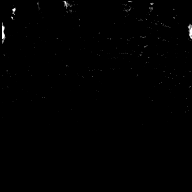

[Series 28: post t1_twist_tra_dyn-copy center · axial · 3.5mm · 0.83mm/px · 1 of 20 slices shown (7 of 22)]
[im 1/20]
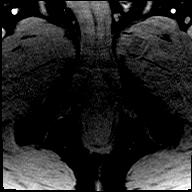

[Series 29: post t1_twist_tra_dyn-copy cent_sub_ttc=(id) · axial · 3.5mm · 0.83mm/px · 1 of 20 slices shown (6 of 21)]
[im 1/20]
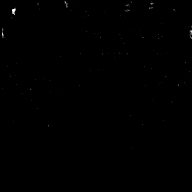

[Series 30: post t1_twist_tra_dyn-copy center · axial · 3.5mm · 0.83mm/px · 1 of 20 slices shown (8 of 22)]
[im 1/20]
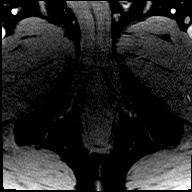

[Series 31: post t1_twist_tra_dyn-copy cent_sub_ttc=(id) · axial · 3.5mm · 0.83mm/px · 1 of 20 slices shown (7 of 21)]
[im 1/20]
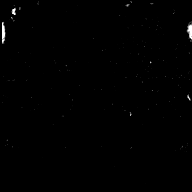

[Series 32: post t1_twist_tra_dyn-copy center · axial · 3.5mm · 0.83mm/px · 1 of 20 slices shown (9 of 22)]
[im 1/20]
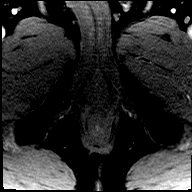

[Series 33: post t1_twist_tra_dyn-copy cent_sub_ttc=(id) · axial · 3.5mm · 0.83mm/px · 1 of 20 slices shown (8 of 21)]
[im 1/20]
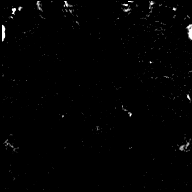

[Series 34: post t1_twist_tra_dyn-copy center · axial · 3.5mm · 0.83mm/px · 1 of 20 slices shown (10 of 22)]
[im 1/20]
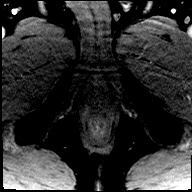

[Series 35: post t1_twist_tra_dyn-copy cent_sub_ttc=(id) · axial · 3.5mm · 0.83mm/px · 1 of 20 slices shown (9 of 21)]
[im 1/20]
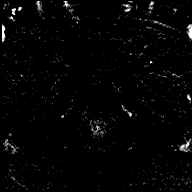

[Series 36: post t1_twist_tra_dyn-copy center · axial · 3.5mm · 0.83mm/px · 1 of 20 slices shown (11 of 22)]
[im 1/20]
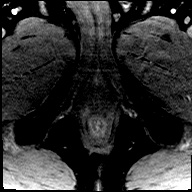

[Series 37: post t1_twist_tra_dyn-copy cent_sub_ttc=(id) · axial · 3.5mm · 0.83mm/px · 1 of 20 slices shown (10 of 21)]
[im 1/20]
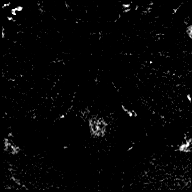

[Series 38: post t1_twist_tra_dyn-copy center · axial · 3.5mm · 0.83mm/px · 1 of 20 slices shown (12 of 22)]
[im 1/20]
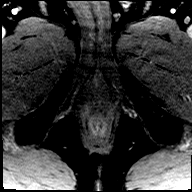

[Series 39: post t1_twist_tra_dyn-copy cent_sub_ttc=(id) · axial · 3.5mm · 0.83mm/px · 1 of 20 slices shown (11 of 21)]
[im 1/20]
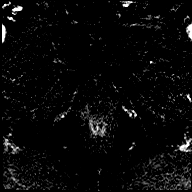

[Series 40: post t1_twist_tra_dyn-copy center · axial · 3.5mm · 0.83mm/px · 1 of 20 slices shown (13 of 22)]
[im 1/20]
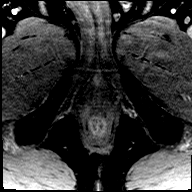

[Series 41: post t1_twist_tra_dyn-copy cent_sub_ttc=(id) · axial · 3.5mm · 0.83mm/px · 1 of 20 slices shown (12 of 21)]
[im 1/20]
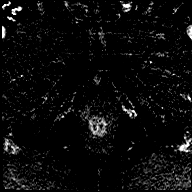

[Series 42: post t1_twist_tra_dyn-copy center · axial · 3.5mm · 0.83mm/px · 1 of 20 slices shown (14 of 22)]
[im 1/20]
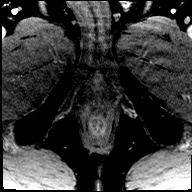

[Series 43: post t1_twist_tra_dyn-copy cent_sub_ttc=(id) · axial · 3.5mm · 0.83mm/px · 1 of 20 slices shown (13 of 21)]
[im 1/20]
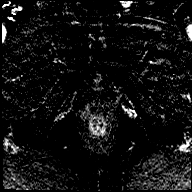

[Series 44: post t1_twist_tra_dyn-copy center · axial · 3.5mm · 0.83mm/px · 1 of 20 slices shown (15 of 22)]
[im 1/20]
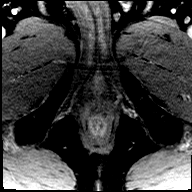

[Series 45: post t1_twist_tra_dyn-copy cent_sub_ttc=(id) · axial · 3.5mm · 0.83mm/px · 1 of 20 slices shown (14 of 21)]
[im 1/20]
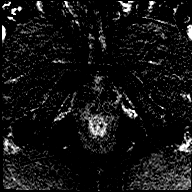

[Series 46: post t1_twist_tra_dyn-copy center · axial · 3.5mm · 0.83mm/px · 1 of 20 slices shown (16 of 22)]
[im 1/20]
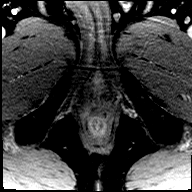

[Series 47: post t1_twist_tra_dyn-copy cent_sub_ttc=(id) · axial · 3.5mm · 0.83mm/px · 1 of 20 slices shown (15 of 21)]
[im 1/20]
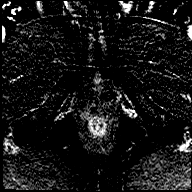

[Series 48: post t1_twist_tra_dyn-copy center · axial · 3.5mm · 0.83mm/px · 1 of 20 slices shown (17 of 22)]
[im 1/20]
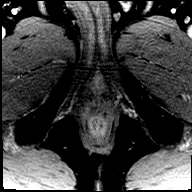

[Series 49: post t1_twist_tra_dyn-copy cent_sub_ttc=(id) · axial · 3.5mm · 0.83mm/px · 1 of 20 slices shown (16 of 21)]
[im 1/20]
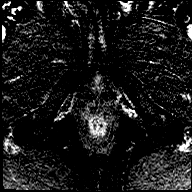

[Series 50: post t1_twist_tra_dyn-copy center · axial · 3.5mm · 0.83mm/px · 1 of 20 slices shown (18 of 22)]
[im 1/20]
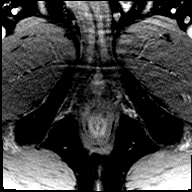

[Series 51: post t1_twist_tra_dyn-copy cent_sub_ttc=(id) · axial · 3.5mm · 0.83mm/px · 1 of 20 slices shown (17 of 21)]
[im 1/20]
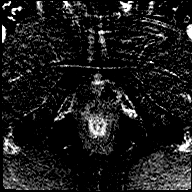

[Series 52: post t1_twist_tra_dyn-copy center · axial · 3.5mm · 0.83mm/px · 1 of 20 slices shown (19 of 22)]
[im 1/20]
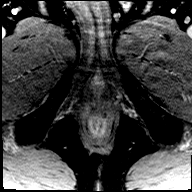

[Series 53: post t1_twist_tra_dyn-copy cent_sub_ttc=(id) · axial · 3.5mm · 0.83mm/px · 1 of 20 slices shown (18 of 21)]
[im 1/20]
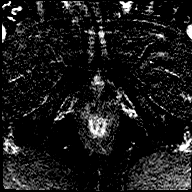

[Series 54: post t1_twist_tra_dyn-copy center · axial · 3.5mm · 0.83mm/px · 1 of 20 slices shown (20 of 22)]
[im 1/20]
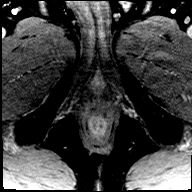

[Series 55: post t1_twist_tra_dyn-copy cent_sub_ttc=(id) · axial · 3.5mm · 0.83mm/px · 1 of 20 slices shown (19 of 21)]
[im 1/20]
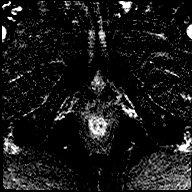

[Series 56: post t1_twist_tra_dyn-copy center · axial · 3.5mm · 0.83mm/px · 1 of 20 slices shown (21 of 22)]
[im 1/20]
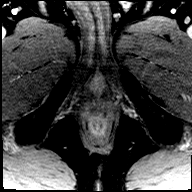

[Series 57: post t1_twist_tra_dyn-copy cent_sub_ttc=(id) · axial · 3.5mm · 0.83mm/px · 1 of 20 slices shown (20 of 21)]
[im 1/20]
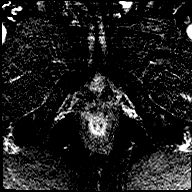

[Series 58: post t1_twist_tra_dyn-copy center · axial · 3.5mm · 0.83mm/px · 1 of 20 slices shown (22 of 22)]
[im 1/20]
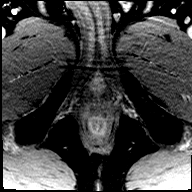

[Series 59: post t1_twist_tra_dyn-copy cent_sub_ttc=(id) · axial · 3.5mm · 0.83mm/px · 1 of 20 slices shown (21 of 21)]
[im 1/20]
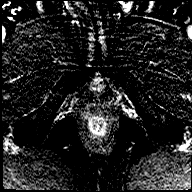

[56 of 56 positions shown; findings below may reference images not displayed]

FINDINGS: Prostate:

Peripheral zone: Prostatitis without high risk lesions. Vague areas
of T2 hypointensity with corresponding linear and wedge-shaped
hypointensity on the ADC map.

Transitional zone: Signs of BPH without high-risk lesion.

Volume: Prostate measures 4.9 x 3.8 x 5.2 cm (volume = 51 cc)

Transcapsular spread:  Absent

Seminal vesicle involvement: Absent

Neurovascular bundle involvement: Absent

Pelvic adenopathy: Absent

Bone metastasis: Absent

Other findings: None
IMPRESSION: No evidence of high-grade or macroscopic prostate carcinoma.

Changes of BPH and sequela of prostatitis.

## 2019-10-15 MED ORDER — GADOBENATE DIMEGLUMINE 529 MG/ML IV SOLN
18.0000 mL | Freq: Once | INTRAVENOUS | Status: AC | PRN
  Administered 2019-10-15: 18 mL via INTRAVENOUS

## 2019-10-22 DIAGNOSIS — N400 Enlarged prostate without lower urinary tract symptoms: Secondary | ICD-10-CM | POA: Diagnosis not present

## 2019-10-22 DIAGNOSIS — R972 Elevated prostate specific antigen [PSA]: Secondary | ICD-10-CM | POA: Diagnosis not present

## 2019-10-23 DIAGNOSIS — F4321 Adjustment disorder with depressed mood: Secondary | ICD-10-CM | POA: Diagnosis not present

## 2019-11-27 DIAGNOSIS — F4321 Adjustment disorder with depressed mood: Secondary | ICD-10-CM | POA: Diagnosis not present

## 2019-12-04 DIAGNOSIS — H43812 Vitreous degeneration, left eye: Secondary | ICD-10-CM | POA: Diagnosis not present

## 2019-12-23 DIAGNOSIS — F4321 Adjustment disorder with depressed mood: Secondary | ICD-10-CM | POA: Diagnosis not present

## 2020-01-04 DIAGNOSIS — H43812 Vitreous degeneration, left eye: Secondary | ICD-10-CM | POA: Diagnosis not present

## 2020-01-22 DIAGNOSIS — F4321 Adjustment disorder with depressed mood: Secondary | ICD-10-CM | POA: Diagnosis not present

## 2020-01-27 DIAGNOSIS — D1801 Hemangioma of skin and subcutaneous tissue: Secondary | ICD-10-CM | POA: Diagnosis not present

## 2020-01-27 DIAGNOSIS — D225 Melanocytic nevi of trunk: Secondary | ICD-10-CM | POA: Diagnosis not present

## 2020-01-27 DIAGNOSIS — L814 Other melanin hyperpigmentation: Secondary | ICD-10-CM | POA: Diagnosis not present

## 2020-01-27 DIAGNOSIS — D485 Neoplasm of uncertain behavior of skin: Secondary | ICD-10-CM | POA: Diagnosis not present

## 2020-01-27 DIAGNOSIS — L821 Other seborrheic keratosis: Secondary | ICD-10-CM | POA: Diagnosis not present

## 2020-02-26 DIAGNOSIS — F4321 Adjustment disorder with depressed mood: Secondary | ICD-10-CM | POA: Diagnosis not present

## 2020-04-01 DIAGNOSIS — F4321 Adjustment disorder with depressed mood: Secondary | ICD-10-CM | POA: Diagnosis not present

## 2020-05-13 DIAGNOSIS — F4321 Adjustment disorder with depressed mood: Secondary | ICD-10-CM | POA: Diagnosis not present

## 2021-11-03 ENCOUNTER — Other Ambulatory Visit: Payer: Self-pay | Admitting: Internal Medicine

## 2021-11-03 DIAGNOSIS — R972 Elevated prostate specific antigen [PSA]: Secondary | ICD-10-CM

## 2021-11-25 ENCOUNTER — Other Ambulatory Visit: Payer: Self-pay

## 2021-11-25 ENCOUNTER — Ambulatory Visit
Admission: RE | Admit: 2021-11-25 | Discharge: 2021-11-25 | Disposition: A | Discharge status: Home / Self Care | Payer: BLUE CROSS/BLUE SHIELD | Source: Ambulatory Visit | Attending: Internal Medicine | Admitting: Internal Medicine

## 2021-11-25 DIAGNOSIS — R972 Elevated prostate specific antigen [PSA]: Secondary | ICD-10-CM

## 2021-11-25 IMAGING — MR MR PROSTATE WO/W CM
12 series · 48 of 48 positions shown · IV contrast (multihance)
Comparison: [DATE]

CLINICAL DATA: Elevated PSA

EXAM:
MR PROSTATE WITHOUT AND WITH CONTRAST
TECHNIQUE: Multiplanar multisequence MRI images were obtained of the pelvis
centered about the prostate. Pre and post contrast images were
obtained.
CONTRAST:  17mL MULTIHANCE GADOBENATE DIMEGLUMINE 529 MG/ML IV SOLN

[Series 3: T2 · coronal · 3.0mm · 0.56mm/px · 1 of 25 slices shown (1 of 3)]
[im 1/25]
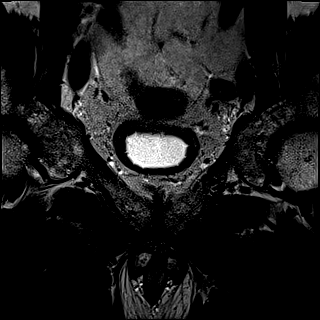

[Series 4: T1 · axial · 5.0mm · 1.25mm/px · z∈[-217,-22]mm · 2 of 80 slices shown]
[im 1/80]
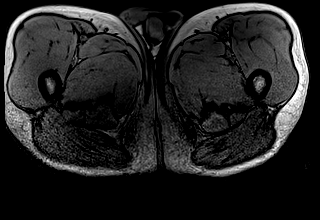
[im 80/80]
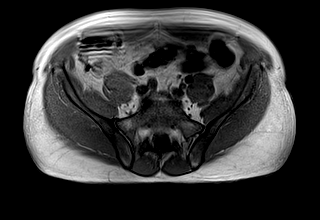

[Series 5: DWI · axial · 3.0mm · 1.75mm/px · z∈[-175,-103]mm · 2 of 75 slices shown (1 of 3)]
[im 1/75]
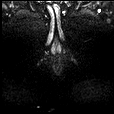
[im 75/75]
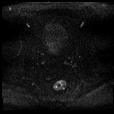

[Series 6: DWI · axial · 3.0mm · 1.75mm/px · 1 of 25 slices shown (2 of 3)]
[im 1/25]
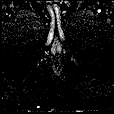

[Series 7: DWI · axial · 3.0mm · 1.75mm/px · 1 of 25 slices shown (3 of 3)]
[im 1/25]
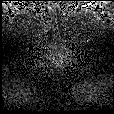

[Series 8: T2 · axial · 3.0mm · 0.56mm/px · 1 of 25 slices shown (2 of 3)]
[im 1/25]
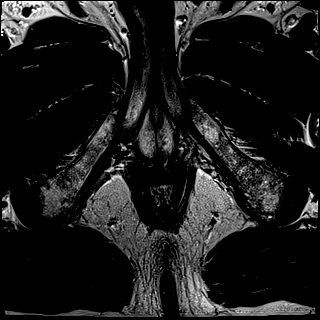

[Series 9: T2 · axial · 1.0mm · 1.04mm/px · z∈[-175,-104]mm · 2 of 72 slices shown (3 of 3)]
[im 1/72]
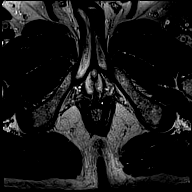
[im 72/72]
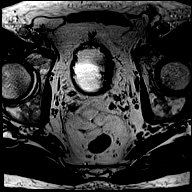

[Series 10: pre t1_twist_tra_dyn · axial · non-contrast · 3.5mm · 0.83mm/px · 1 of 20 slices shown]
[im 1/20]
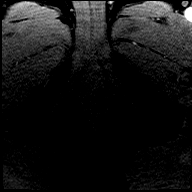

[Series 11: post t1_twist_tra_dyn-copy center · axial · non-contrast · 3.5mm · 0.83mm/px · z∈[-173,-106]mm · 17 of 600 slices shown]
[im 1/600]
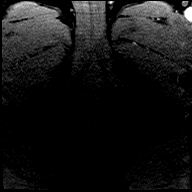
[im 38/600]
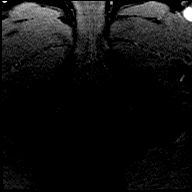
[im 75/600]
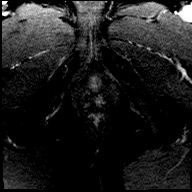
[im 113/600]
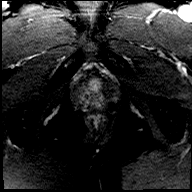
[im 150/600]
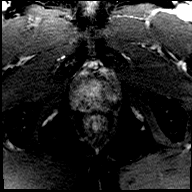
[im 188/600]
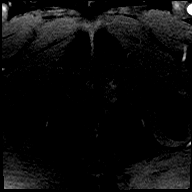
[im 225/600]
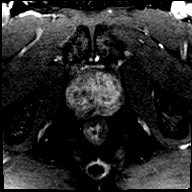
[im 263/600]
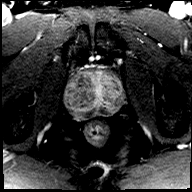
[im 300/600]
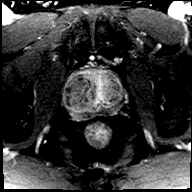
[im 337/600]
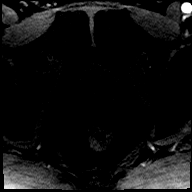
[im 375/600]
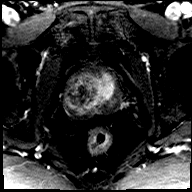
[im 412/600]
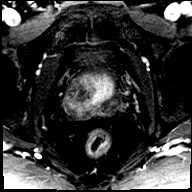
[im 450/600]
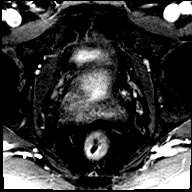
[im 487/600]
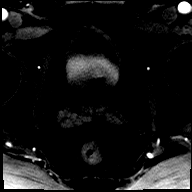
[im 525/600]
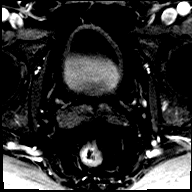
[im 562/600]
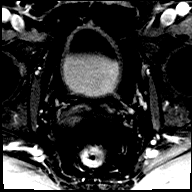
[im 600/600]
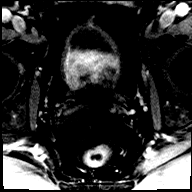

[Series 12: post t1_twist_tra_dyn-copy cent_sub · axial · 3.5mm · 0.83mm/px · z∈[-173,-106]mm · 16 of 578 slices shown]
[im 1/578]
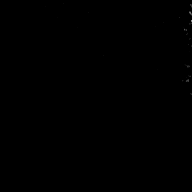
[im 39/578]
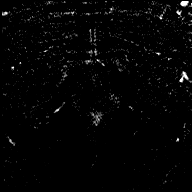
[im 77/578]
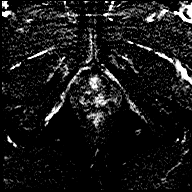
[im 116/578]
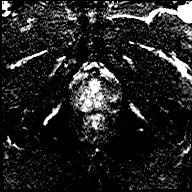
[im 154/578]
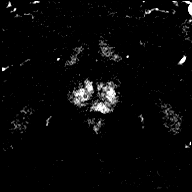
[im 193/578]
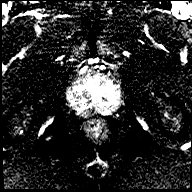
[im 231/578]
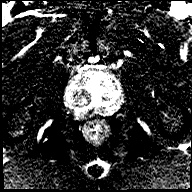
[im 270/578]
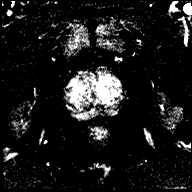
[im 308/578]
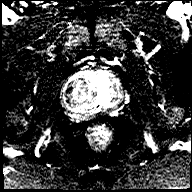
[im 347/578]
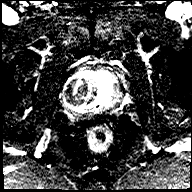
[im 385/578]
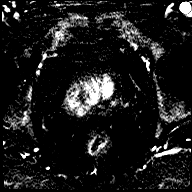
[im 424/578]
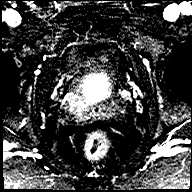
[im 462/578]
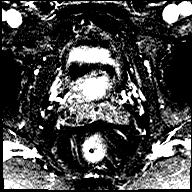
[im 501/578]
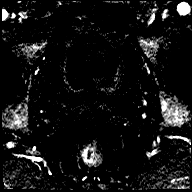
[im 539/578]
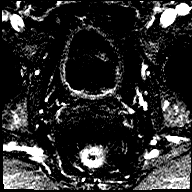
[im 578/578]
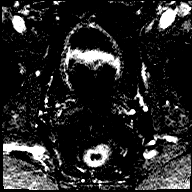

[Series 13: t1_vibe_dixon_tra_f · axial · 2.5mm · 0.91mm/px · z∈[-218,-21]mm · 2 of 80 slices shown]
[im 1/80]
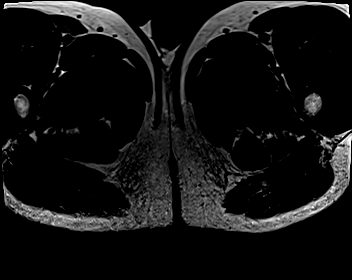
[im 80/80]
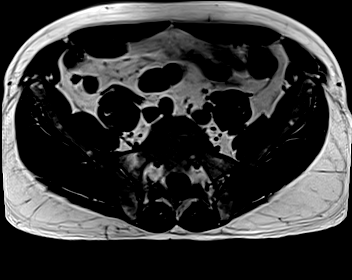

[Series 14: t1_vibe_dixon_tra_w · axial · 2.5mm · 0.91mm/px · z∈[-218,-21]mm · 2 of 80 slices shown]
[im 1/80]
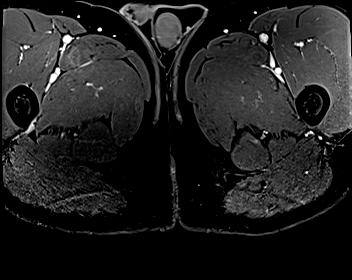
[im 80/80]
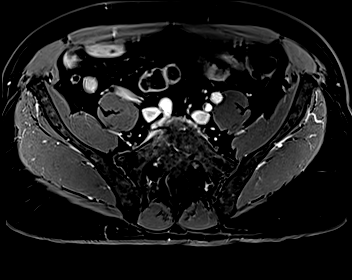

[48 of 48 positions shown; findings below may reference images not displayed]

FINDINGS: Prostate: Demonstrates moderate central gland enlargement and
heterogeneity, consistent with benign prostatic hyperplasia. No
dominant central gland nodule.

The peripheral zone is mildly compressed and again demonstrates
heterogeneous T2 signal, without masslike features. No areas of
restricted diffusion. Mild heterogeneous early postcontrast
enhancement throughout the peripheral zone is relatively diffuse.

Volume: 5.3 x 4.5 x 4.3 cm (volume = 54 cm^3)

Transcapsular spread:  Absent

Seminal vesicle involvement: Absent. Note is made of similar T2
hypointensity and T1 hyperintensity within the seminal vesicles
included on [DATE].

Neurovascular bundle involvement: Absent

Pelvic adenopathy: Absent

Bone metastasis: Absent

Other findings: No significant free fluid. Normal urinary bladder.
Tiny bilateral fat containing inguinal hernias.
IMPRESSION: 1. No findings of high-grade macroscopic prostate carcinoma.
2. Heterogeneous T2 signal and early postcontrast enhancement
throughout the peripheral zone, again suspicious for prostatitis.
3. Chronic abnormal seminal vesicle signal, possibly secondary to
biopsy related hemorrhage.
4. Moderate benign prostatic hyperplasia.

## 2021-11-25 MED ORDER — GADOBENATE DIMEGLUMINE 529 MG/ML IV SOLN
17.0000 mL | Freq: Once | INTRAVENOUS | Status: AC | PRN
  Administered 2021-11-25: 17 mL via INTRAVENOUS

## 2022-06-15 DIAGNOSIS — K4090 Unilateral inguinal hernia, without obstruction or gangrene, not specified as recurrent: Secondary | ICD-10-CM | POA: Insufficient documentation

## 2022-10-22 ENCOUNTER — Encounter: Payer: Self-pay | Admitting: Internal Medicine

## 2022-12-26 ENCOUNTER — Encounter: Payer: Self-pay | Admitting: Internal Medicine

## 2023-01-15 ENCOUNTER — Ambulatory Visit (AMBULATORY_SURGERY_CENTER): Payer: BLUE CROSS/BLUE SHIELD

## 2023-01-15 VITALS — Ht 70.0 in | Wt 190.0 lb

## 2023-01-15 DIAGNOSIS — Z86010 Personal history of colonic polyps: Secondary | ICD-10-CM

## 2023-01-15 MED ORDER — NA SULFATE-K SULFATE-MG SULF 17.5-3.13-1.6 GM/177ML PO SOLN: 1.0000 | Freq: Once | ORAL | 0 refills | Status: AC

## 2023-01-15 NOTE — Progress Notes (Signed)

## 2023-02-07 ENCOUNTER — Ambulatory Visit (AMBULATORY_SURGERY_CENTER): Payer: Medicare Other | Admitting: Internal Medicine

## 2023-02-07 ENCOUNTER — Encounter: Payer: Self-pay | Admitting: Internal Medicine

## 2023-02-07 VITALS — BP 122/57 | HR 69 | Temp 98.4°F | Resp 9 | Ht 70.0 in | Wt 190.0 lb

## 2023-02-07 DIAGNOSIS — Z09 Encounter for follow-up examination after completed treatment for conditions other than malignant neoplasm: Secondary | ICD-10-CM | POA: Diagnosis not present

## 2023-02-07 DIAGNOSIS — Z86010 Personal history of colonic polyps: Secondary | ICD-10-CM | POA: Diagnosis not present

## 2023-02-07 DIAGNOSIS — K635 Polyp of colon: Secondary | ICD-10-CM | POA: Diagnosis not present

## 2023-02-07 DIAGNOSIS — D122 Benign neoplasm of ascending colon: Secondary | ICD-10-CM

## 2023-02-07 DIAGNOSIS — D124 Benign neoplasm of descending colon: Secondary | ICD-10-CM

## 2023-02-07 MED ORDER — SODIUM CHLORIDE 0.9 % IV SOLN: 500.0000 mL | Freq: Once | INTRAVENOUS | Status: DC

## 2023-02-07 NOTE — Progress Notes (Signed)
Report to PACU, RN, vss, BBS= Clear.  

## 2023-02-07 NOTE — Progress Notes (Signed)
GASTROENTEROLOGY PROCEDURE H&P NOTE   Primary Care Physician: Garlan Fillers, MD    Reason for Procedure:  History of colon polyps  Plan:    Surveillance colonoscopy  Patient is appropriate for endoscopic procedure(s) in the ambulatory ([facility]) setting.  The nature of the procedure, as well as the risks, benefits, and alternatives were carefully and thoroughly reviewed with the patient. Ample time for discussion and questions allowed. The patient understood, was satisfied, and agreed to proceed.     HPI: Stephen Higgins is a 67 y.o. male who presents for surveillance colonoscopy.  Medical history as below.  Tolerated the prep.  No recent chest pain or shortness of breath.  No abdominal pain today.  Past Medical History:  Diagnosis Date   Cancer (HCC)    skin cancer face basal  cell    Hyperlipidemia    slightly elevated- no medicines    Hypertension    MVP (mitral valve prolapse)    with tachycardia- past workup negative     Past Surgical History:  Procedure Laterality Date   COLONOSCOPY  2007   14 yra ago in [place]   finger laceration repair     INGUINAL HERNIA REPAIR  1963   MOHS SURGERY     basal cell removal face    TONSILLECTOMY  1964   WISDOM TOOTH EXTRACTION      Prior to Admission medications   Medication Sig Start Date End Date Taking? Authorizing Provider  aspirin 325 MG tablet Take 325 mg by mouth daily.    [provider]  KRILL OIL PO Take by mouth.    [provider]  meloxicam (MOBIC) 15 MG tablet Take 15 mg by mouth daily. Patient not taking: Reported on 01/15/2023 07/02/19   [provider]  metoprolol succinate (TOPROL-XL) 100 MG 24 hr tablet Take 100 mg by mouth at bedtime. 07/02/19   [provider]  Misc Natural Products (TUMERSAID PO) Take by mouth daily.    [provider]  Multiple Vitamin (MULTI-VITAMIN PO) Take by mouth daily. For [employer]    [provider]  Multiple  Vitamins-Minerals (AIRBORNE PO) Take by mouth daily. Patient not taking: Reported on 01/15/2023    [provider]  Probiotic Product (PROBIOTIC DAILY PO) Take by mouth daily.    [provider]  PROBIOTIC, LACTOBACILLUS, PO daily. lactobacillus combination no.4 (PROBIOTIC) 3 billion cell cap 02/11/18   [provider]  tadalafil (CIALIS) 20 MG tablet Take 20 mg by mouth every other day as needed. 08/02/19   [provider]  Wheat Dextrin (BENEFIBER PO) Take by mouth daily.    [provider]    Current Outpatient Medications  Medication Sig Dispense Refill   aspirin 325 MG tablet Take 325 mg by mouth daily.     KRILL OIL PO Take by mouth.     meloxicam (MOBIC) 15 MG tablet Take 15 mg by mouth daily. (Patient not taking: Reported on 01/15/2023)     metoprolol succinate (TOPROL-XL) 100 MG 24 hr tablet Take 100 mg by mouth at bedtime.     Misc Natural Products (TUMERSAID PO) Take by mouth daily.     Multiple Vitamin (MULTI-VITAMIN PO) Take by mouth daily. For MEn     Multiple Vitamins-Minerals (AIRBORNE PO) Take by mouth daily. (Patient not taking: Reported on 01/15/2023)     Probiotic Product (PROBIOTIC DAILY PO) Take by mouth daily.     PROBIOTIC, LACTOBACILLUS, PO daily. lactobacillus combination no.4 (PROBIOTIC)  3 billion cell cap     tadalafil (CIALIS) 20 MG tablet Take 20 mg by mouth every other day as needed.     Wheat Dextrin (BENEFIBER PO) Take by mouth daily.     Current Facility-Administered Medications  Medication Dose Route Frequency Provider Last Rate Last Admin   0.9 %  sodium chloride infusion  500 mL Intravenous Once Derran Sear, Carie Caddy, MD       0.9 %  sodium chloride infusion  500 mL Intravenous Once Charisa Twitty, Carie Caddy, MD        Allergies as of 02/07/2023 - Review Complete 01/15/2023  Allergen Reaction Noted   Ciprofloxacin Anaphylaxis 09/17/2019    Family History  Problem Relation Age of Onset   Colon cancer Neg Hx    Colon polyps Neg  Hx    Esophageal cancer Neg Hx    Rectal cancer Neg Hx    Stomach cancer Neg Hx     Social History   Socioeconomic History   Marital status: Married    Spouse name: Not on file   Number of children: Not on file   Years of education: Not on file   Highest education level: Not on file  Occupational History   Not on file  Tobacco Use   Smoking status: Never   Smokeless tobacco: Never  Vaping Use   Vaping Use: Never used  Substance and Sexual Activity   Alcohol use: Yes    Alcohol/week: 2.0 standard drinks of alcohol    Types: 2 Glasses of wine per week    Comment: 8 oz red wine fri/sat nights   Drug use: Never   Sexual activity: Not on file  Other Topics Concern   Not on file  Social History Narrative   Not on file   Social Determinants of Health   Financial Resource Strain: Not on file  Food Insecurity: Not on file  Transportation Needs: Not on file  Physical Activity: Not on file  Stress: Not on file  Social Connections: Not on file  Intimate Partner Violence: Not on file    Physical Exam: Vital signs in last 24 hours: @BP  (!) 140/76   Pulse 75   Temp 98.4 F (36.9 C)   Ht 5\' 10"  (1.778 m)   Wt 190 lb (86.2 kg)   SpO2 96%   BMI 27.26 kg/m  GEN: NAD EYE: Sclerae anicteric ENT: MMM CV: Non-tachycardic Pulm: CTA b/l GI: Soft, NT/ND NEURO:  Alert & Oriented x 3   Erick Blinks, MD [facility]  02/07/2023 11:07 AM

## 2023-02-07 NOTE — Patient Instructions (Signed)
YOU HAD AN ENDOSCOPIC PROCEDURE TODAY AT THE [facility]:   Refer to the procedure report that was given to you for any specific questions about what was found during the examination.  If the procedure report does not answer your questions, please call your gastroenterologist to clarify.  If you requested that your care partner not be given the details of your procedure findings, then the procedure report has been included in a sealed envelope for you to review at your convenience later.  **Handouts given on polyps, hemorrhoids and diverticulosis**  YOU SHOULD EXPECT: Some feelings of bloating in the abdomen. Passage of more gas than usual.  Walking can help get rid of the air that was put into your GI tract during the procedure and reduce the bloating. If you had a lower endoscopy (such as a colonoscopy or flexible sigmoidoscopy) you may notice spotting of blood in your stool or on the toilet paper. If you underwent a bowel prep for your procedure, you may not have a normal bowel movement for a few days.  Please Note:  You might notice some irritation and congestion in your nose or some drainage.  This is from the oxygen used during your procedure.  There is no need for concern and it should clear up in a day or so.  SYMPTOMS TO REPORT IMMEDIATELY:  Following lower endoscopy (colonoscopy or flexible sigmoidoscopy):  Excessive amounts of blood in the stool  Significant tenderness or worsening of abdominal pains  Swelling of the abdomen that is new, acute  Fever of 100F or higher  For urgent or emergent issues, a gastroenterologist can be reached at any hour by calling [phone]. Do not use MyChart messaging for urgent concerns.    DIET:  We do recommend a small meal at first, but then you may proceed to your regular diet.  Drink plenty of fluids but you should avoid alcoholic beverages for 24 hours.  ACTIVITY:  You should plan to take it easy for the rest of today and you  should NOT DRIVE or use heavy machinery until tomorrow (because of the sedation medicines used during the test).    FOLLOW UP: Our staff will call the number listed on your records the next business day following your procedure.  We will call around 7:15- 8:00 am to check on you and address any questions or concerns that you may have regarding the information given to you following your procedure. If we do not reach you, we will leave a message.     If any biopsies were taken you will be contacted by phone or by letter within the next 1-3 weeks.  Please call us at [phone] if you have not heard about the biopsies in 3 weeks.    SIGNATURES/CONFIDENTIALITY: You and/or your care partner have signed paperwork which will be entered into your electronic medical record.  These signatures attest to the fact that that the information above on your After Visit Summary has been reviewed and is understood.  Full responsibility of the confidentiality of this discharge information lies with you and/or your care-partner. 

## 2023-02-07 NOTE — Op Note (Signed)
[facility] Patient Name: Stephen Higgins Procedure Date: 02/07/2023 11:16 AM MRN: [id number] Endoscopist: Beverley Fiedler , MD, [id number] Age: 67 Referring MD:  Date of Birth: 1956-07-18 Gender: Male Account #: [id number] Procedure:                Colonoscopy Indications:              High risk colon cancer surveillance: Personal                            history of multiple adenomas, Last colonoscopy:                            December 2020 (8 adenomatous polyps removed                            including those greater than a centimeter) Medicines:                Monitored Anesthesia Care Procedure:                Pre-Anesthesia Assessment:                           - Prior to the procedure, a History and Physical                            was performed, and patient medications and                            allergies were reviewed. The patient's tolerance of                            previous anesthesia was also reviewed. The risks                            and benefits of the procedure and the sedation                            options and risks were discussed with the patient.                            All questions were answered, and informed consent                            was obtained. Prior Anticoagulants: The patient has                            taken no anticoagulant or antiplatelet agents. ASA                            Grade Assessment: II - A patient with mild systemic                            disease. After reviewing the risks and benefits,  the patient was deemed in satisfactory condition to                            undergo the procedure.                           After obtaining informed consent, the colonoscope                            was passed under direct vision. Throughout the                            procedure, the patient's blood pressure, pulse, and                            oxygen saturations were monitored  continuously. The                            Olympus SN [id number] was introduced through the anus                            and advanced to the cecum, identified by                            appendiceal orifice and ileocecal valve. The                            colonoscopy was performed without difficulty. The                            patient tolerated the procedure well. The quality                            of the bowel preparation was good. The ileocecal                            valve, appendiceal orifice, and rectum were                            photographed. Scope In: 11:21:56 AM Scope Out: 11:37:32 AM Scope Withdrawal Time: 0 hours 11 minutes 54 seconds  Total Procedure Duration: 0 hours 15 minutes 36 seconds  Findings:                 The digital rectal exam was normal.                           A 4 mm polyp was found in the ascending colon. The                            polyp was sessile. The polyp was removed with a                            cold snare. Resection and retrieval were complete.  A 5 mm polyp was found in the descending colon. The                            polyp was sessile. The polyp was removed with a                            cold snare. Resection and retrieval were complete.                           A few medium-mouthed diverticula were found in the                            sigmoid colon.                           Internal hemorrhoids were found during                            retroflexion. The hemorrhoids were small. Complications:            No immediate complications. Estimated Blood Loss:     Estimated blood loss: none. Impression:               - One 4 mm polyp in the ascending colon, removed                            with a cold snare. Resected and retrieved.                           - One 5 mm polyp in the descending colon, removed                            with a cold snare. Resected and retrieved.                            - Diverticulosis in the sigmoid colon.                           - Small internal hemorrhoids. Recommendation:           - Patient has a contact number available for                            emergencies. The signs and symptoms of potential                            delayed complications were discussed with the                            patient. Return to normal activities tomorrow.                            Written discharge instructions were provided to the  patient.                           - Resume previous diet.                           - Continue present medications.                           - Await pathology results.                           - Repeat colonoscopy is recommended for                            surveillance. The colonoscopy date will be                            determined after pathology results from today's                            exam become available for review. Beverley Fiedler, MD 02/07/2023 11:42:20 AM This report has been signed electronically.

## 2023-02-07 NOTE — Progress Notes (Signed)
Called to room to assist during endoscopic procedure.  Patient ID and intended procedure confirmed with present staff. Received instructions for my participation in the procedure from the performing physician.  

## 2023-02-08 ENCOUNTER — Telehealth: Payer: Self-pay

## 2023-02-08 NOTE — Telephone Encounter (Signed)
  Follow up Call-     02/07/2023   11:00 AM  Call back number  Post procedure Call Back phone  # [phone]  Permission to leave phone message Yes     Patient questions:  Do you have a fever, pain , or abdominal swelling? No. Pain Score  0 *  Have you tolerated food without any problems? Yes.    Have you been able to return to your normal activities? Yes.    Do you have any questions about your discharge instructions: Diet   No. Medications  No. Follow up visit  No.  Do you have questions or concerns about your Care? No.  Actions: * If pain score is 4 or above: No action needed, pain <4.

## 2023-02-13 ENCOUNTER — Encounter: Payer: Self-pay | Admitting: Internal Medicine

## 8387-06-09 DEATH — deceased
